# Patient Record
Sex: Female | Born: 1980 | Race: Black or African American | Hispanic: No | Marital: Single | Smoking: Current every day smoker
Health system: Southern US, Community
[De-identification: ages and names within clinical notes are randomized; demographics above are authoritative.]

## PROBLEM LIST (undated history)

## (undated) DIAGNOSIS — R011 Cardiac murmur, unspecified: Secondary | ICD-10-CM

## (undated) HISTORY — DX: Cardiac murmur, unspecified: R01.1

---

## 2001-02-13 ENCOUNTER — Emergency Department (HOSPITAL_COMMUNITY): Admission: EM | Admit: 2001-02-13 | Discharge: 2001-02-13 | Payer: Self-pay | Admitting: Emergency Medicine

## 2003-05-23 ENCOUNTER — Inpatient Hospital Stay (HOSPITAL_COMMUNITY): Admission: AD | Admit: 2003-05-23 | Discharge: 2003-05-23 | Payer: Self-pay | Admitting: Obstetrics and Gynecology

## 2005-01-07 ENCOUNTER — Inpatient Hospital Stay (HOSPITAL_COMMUNITY): Admission: AD | Admit: 2005-01-07 | Discharge: 2005-01-07 | Payer: Self-pay | Admitting: Obstetrics & Gynecology

## 2006-12-07 ENCOUNTER — Emergency Department (HOSPITAL_COMMUNITY): Admission: EM | Admit: 2006-12-07 | Discharge: 2006-12-07 | Payer: Self-pay | Admitting: Emergency Medicine

## 2008-05-05 IMAGING — CR DG CHEST 2V
2 series · 2 of 2 positions shown · non-contrast
Comparison: None

CLINICAL DATA: Syncope. Weakness.

CHEST - 2 VIEW

[w chest pa]
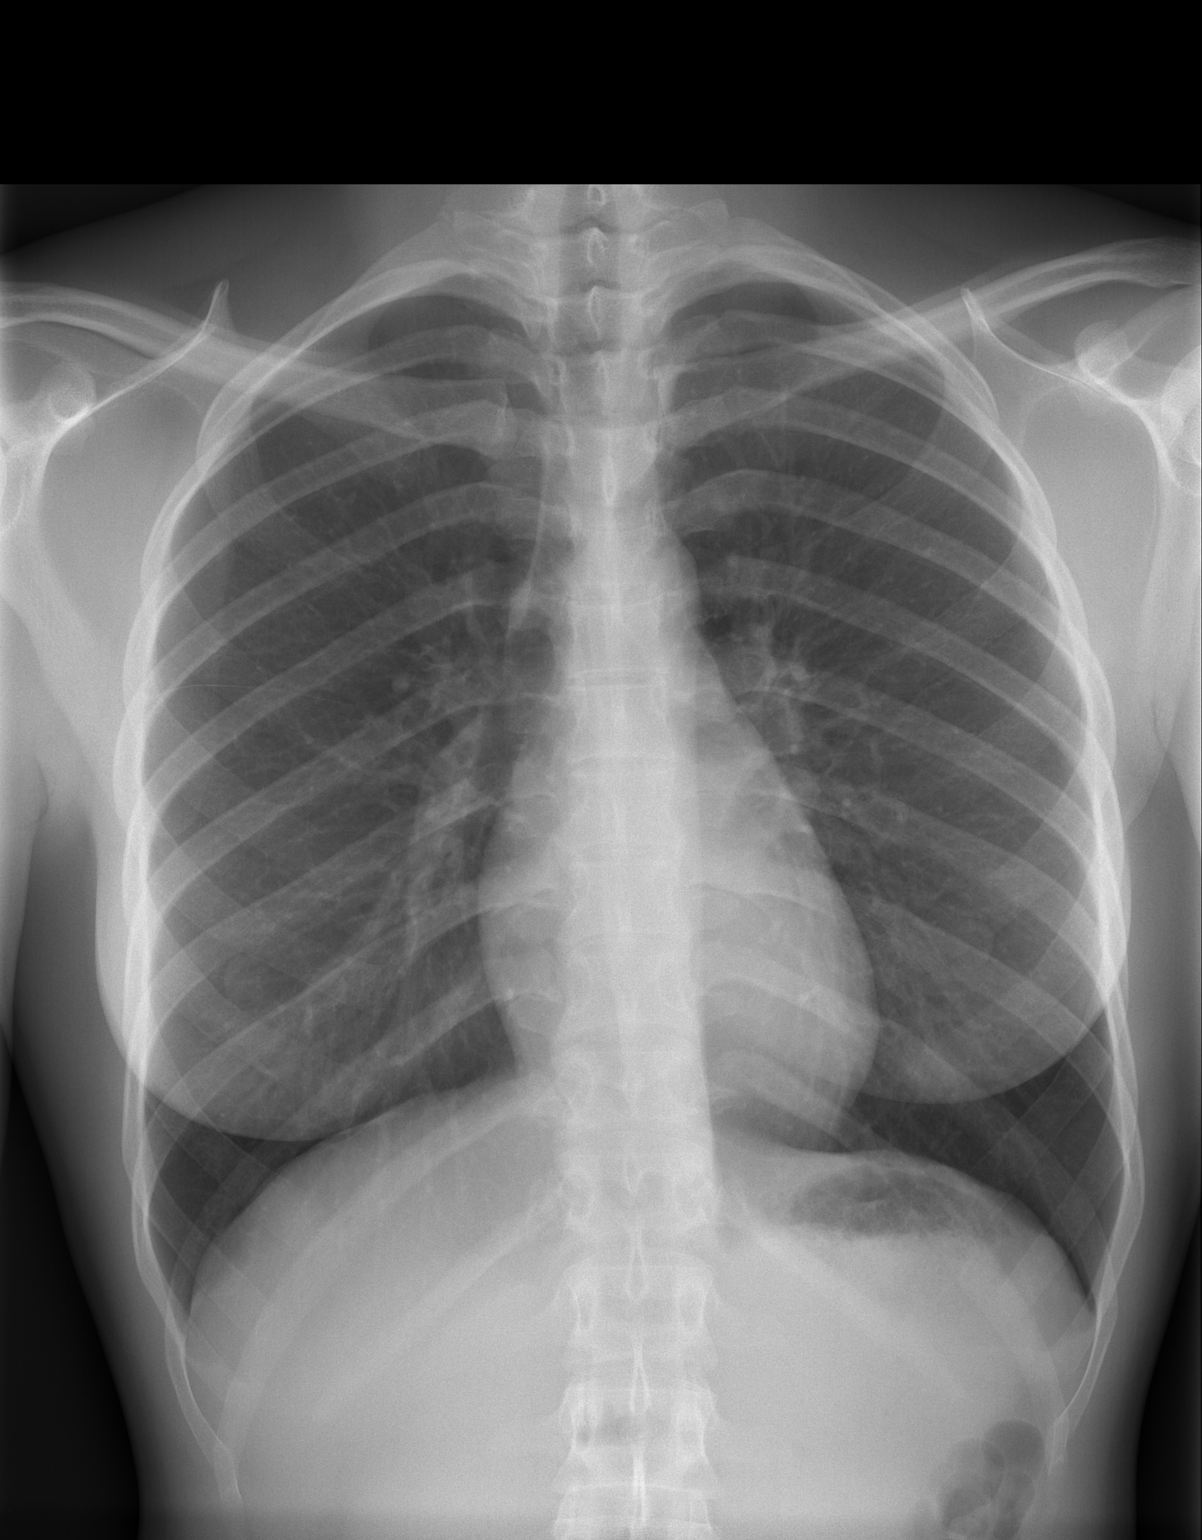

[w chest lat]
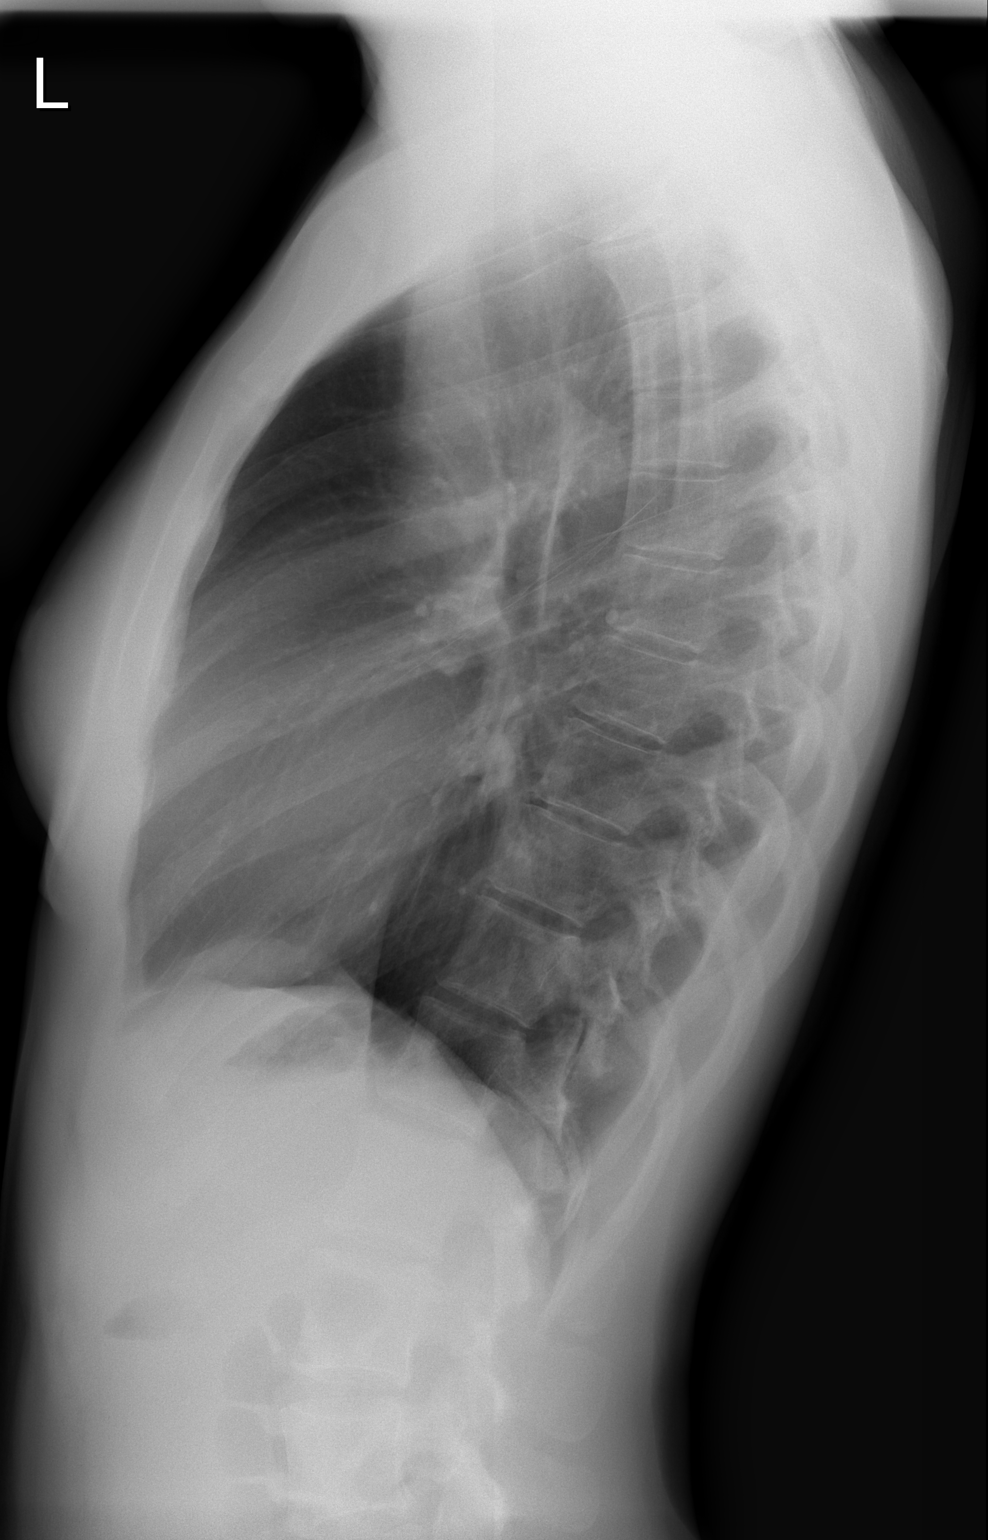

[2 of 2 positions shown; findings below may reference images not displayed]

FINDINGS: The lungs appear clear. The heart and mediastinum appear
unremarkable. No acute thoracic findings are detected.

IMPRESSION

Normal exam.

## 2017-10-14 ENCOUNTER — Encounter (HOSPITAL_COMMUNITY): Payer: Self-pay | Admitting: Emergency Medicine

## 2017-10-14 ENCOUNTER — Ambulatory Visit (HOSPITAL_COMMUNITY)
Admission: EM | Admit: 2017-10-14 | Discharge: 2017-10-14 | Disposition: A | Discharge status: Home / Self Care | Payer: Self-pay | Attending: Family Medicine | Admitting: Family Medicine

## 2017-10-14 DIAGNOSIS — K0889 Other specified disorders of teeth and supporting structures: Secondary | ICD-10-CM

## 2017-10-14 DIAGNOSIS — R220 Localized swelling, mass and lump, head: Secondary | ICD-10-CM

## 2017-10-14 MED ORDER — AMOXICILLIN-POT CLAVULANATE 875-125 MG PO TABS: 1.0000 | ORAL_TABLET | Freq: Two times a day (BID) | ORAL | 0 refills | Status: AC

## 2017-10-14 NOTE — Discharge Instructions (Signed)
These follow-up on Friday with demonstrated at have the tooth pulled, evaluate swelling.  Today we have given you an antibiotic. This should help with pain as any infection is cleared.   For pain please take 600mg -800mg  of Ibuprofen every 8 hours, take with 1000 mg of Tylenol Extra strength every 8 hours. These are safe to take together. Please take with food.   Please return if you start to experience significant swelling of your face, experiencing fever.

## 2017-10-14 NOTE — ED Provider Notes (Signed)
[facility]    CSN: [id number] Arrival date & time: 10/14/17  1308     History   Chief Complaint Chief Complaint  Patient presents with  . Dental Problem    HPI Megan Bean is a 37 y.o. female significant past medical history presenting today with dental pain. She states that she has pain and swelling to the her left lower jaw.  She has an appointment on Friday in 3 days to get her tooth pulled.  She states her pain is a 3 out of 10.  Mainly concerned about the swelling.  Her swelling began yesterday.  Denies fevers.  Denies difficulty swallowing.  HPI  History reviewed. No pertinent past medical history.  There are no active problems to display for this patient.   History reviewed. No pertinent surgical history.  OB History    No data available       Home Medications    Prior to Admission medications   Medication Sig Start Date End Date Taking? Authorizing Provider  valACYclovir (VALTREX) 500 MG tablet Take 500 mg by mouth 2 (two) times daily.   Yes [provider]  amoxicillin-clavulanate (AUGMENTIN) 875-125 MG tablet Take 1 tablet by mouth every 12 (twelve) hours for 5 days. 10/14/17 10/19/17  Tequilla Cousineau, Junius CreamerHallie C, PA-C    Family History No family history on file.  Social History Social History   Tobacco Use  . Smoking status: Not on file  Substance Use Topics  . Alcohol use: Not on file  . Drug use: Not on file     Allergies   Patient has no known allergies.   Review of Systems Review of Systems  Constitutional: Negative for fatigue and fever.  HENT: Positive for dental problem. Negative for trouble swallowing.   Respiratory: Negative for shortness of breath.   Cardiovascular: Negative for chest pain.  Gastrointestinal: Negative for nausea and vomiting.  Neurological: Positive for headaches. Negative for dizziness, weakness and light-headedness.     Physical Exam Triage Vital Signs ED Triage Vitals [10/14/17 1339]  Enc  Vitals Group     BP 130/81     Pulse Rate 86     Resp 16     Temp 98.8 F (37.1 C)     Temp Source Oral     SpO2 100 %     Weight 120 lb (54.4 kg)     Height 5\' 6"  (1.676 m)     Head Circumference      Peak Flow      Pain Score 3     Pain Loc      Pain Edu?      Excl. in GC?    No data found.  Updated Vital Signs BP 130/81   Pulse 86   Temp 98.8 F (37.1 C) (Oral)   Resp 16   Ht 5\' 6"  (1.676 m)   Wt 120 lb (54.4 kg)   LMP 10/11/2017   SpO2 100%   BMI 19.37 kg/m   Visual Acuity Right Eye Distance:   Left Eye Distance:   Bilateral Distance:    Right Eye Near:   Left Eye Near:    Bilateral Near:     Physical Exam  Constitutional: She is oriented to person, place, and time. She appears well-developed and well-nourished. No distress.  HENT:  Head: Normocephalic and atraumatic.  Diffusely poor dentition with multiple fractured teeth and decay.  Patient has obvious left-sided facial swelling to her left lower jaw.  Lower left  jaw gingiva and buccal mucosa without tenderness to palpation.  Eyes: Conjunctivae are normal.  Neck: Normal range of motion. Neck supple.  Cardiovascular: Normal rate.  Pulmonary/Chest: Effort normal. No respiratory distress.  Lymphadenopathy:    She has no cervical adenopathy.  Neurological: She is alert and oriented to person, place, and time.  Skin: Skin is warm and dry.  Psychiatric: She has a normal mood and affect.  Nursing note and vitals reviewed.    UC Treatments / Results  Labs (all labs ordered are listed, but only abnormal results are displayed) Labs Reviewed - No data to display  EKG  EKG Interpretation None       Radiology No results found.  Procedures Procedures (including critical care time)  Medications Ordered in UC Medications - No data to display   Initial Impression / Assessment and Plan / UC Course  I have reviewed the triage vital signs and the nursing notes.  Pertinent labs & imaging results  that were available during my care of the patient were reviewed by me and considered in my medical decision making (see chart for details).     Provided patient with Augmentin to take for 5 days twice daily.  Follow-up with dentistry on Friday for tooth removal and follow-up on swelling. Discussed strict return precautions. Patient verbalized understanding and is agreeable with plan.  Patient not in significant pain, recommended to continue Tylenol and ibuprofen.  She does not need narcotics at this time.   Final Clinical Impressions(s) / UC Diagnoses   Final diagnoses:  Left facial swelling  Pain, dental    ED Discharge Orders        Ordered    amoxicillin-clavulanate (AUGMENTIN) 875-125 MG tablet  Every 12 hours     10/14/17 1400       Controlled Substance Prescriptions [place] Controlled Substance Registry consulted? Not Applicable   Lew Dawes, [place] 10/14/17 1409

## 2017-10-14 NOTE — ED Triage Notes (Signed)
PT has pain and swelling to left lower jaw. PT has an appointment Friday to get tooth pulled

## 2018-01-29 ENCOUNTER — Ambulatory Visit (HOSPITAL_COMMUNITY)
Admission: EM | Admit: 2018-01-29 | Discharge: 2018-01-29 | Disposition: A | Discharge status: Home / Self Care | Payer: Self-pay | Attending: Family Medicine | Admitting: Family Medicine

## 2018-01-29 ENCOUNTER — Encounter (HOSPITAL_COMMUNITY): Payer: Self-pay | Admitting: Family Medicine

## 2018-01-29 DIAGNOSIS — K047 Periapical abscess without sinus: Secondary | ICD-10-CM

## 2018-01-29 MED ORDER — PENICILLIN V POTASSIUM 500 MG PO TABS: 500.0000 mg | ORAL_TABLET | Freq: Four times a day (QID) | ORAL | 0 refills | Status: AC

## 2018-01-29 NOTE — ED Provider Notes (Signed)
[facility]    CSN: [id number] Arrival date & time: 01/29/18  1114     History   Chief Complaint Chief Complaint  Patient presents with  . Abscess    HPI Megan Bean is a 37 y.o. female.  Patient is here with dental pain.  She has had this problem before.  In general her teeth are in carious conditions.  She is unable to afford going to the dentist.   HPI  History reviewed. No pertinent past medical history.  There are no active problems to display for this patient.   History reviewed. No pertinent surgical history.  OB History   None      Home Medications    Prior to Admission medications   Medication Sig Start Date End Date Taking? Authorizing Provider  valACYclovir (VALTREX) 500 MG tablet Take 500 mg by mouth 2 (two) times daily.    [provider]    Family History History reviewed. No pertinent family history.  Social History Social History   Tobacco Use  . Smoking status: Not on file  Substance Use Topics  . Alcohol use: Not on file  . Drug use: Not on file     Allergies   Patient has no known allergies.   Review of Systems Review of Systems  Constitutional: Negative for chills and fever.  HENT: Positive for dental problem. Negative for ear pain and sore throat.   Eyes: Negative for pain and visual disturbance.  Respiratory: Negative for cough and shortness of breath.   Cardiovascular: Negative for chest pain and palpitations.  Gastrointestinal: Negative for abdominal pain and vomiting.  Genitourinary: Negative for dysuria and hematuria.  Musculoskeletal: Negative for arthralgias and back pain.  Skin: Negative for color change and rash.  Neurological: Negative for seizures and syncope.  All other systems reviewed and are negative.    Physical Exam Triage Vital Signs ED Triage Vitals [01/29/18 1137]  Enc Vitals Group     BP 119/72     Pulse Rate 87     Resp 18     Temp 98.5 F (36.9 C)     Temp src    SpO2 100 %     Weight      Height      Head Circumference      Peak Flow      Pain Score 5     Pain Loc      Pain Edu?      Excl. in GC?    No data found.  Updated Vital Signs BP 119/72   Pulse 87   Temp 98.5 F (36.9 C)   Resp 18   SpO2 100%   Visual Acuity Right Eye Distance:   Left Eye Distance:   Bilateral Distance:    Right Eye Near:   Left Eye Near:    Bilateral Near:     Physical Exam  Constitutional: She appears well-developed and well-nourished.  HENT:  Mouth/Throat: Oropharynx is clear and moist.  Left second molar is tender to palpation with tongue blade there is swelling and inflammation at the base of the tooth.  She has obvious facial swelling as well secondary to infection     UC Treatments / Results  Labs (all labs ordered are listed, but only abnormal results are displayed) Labs Reviewed - No data to display  EKG None  Radiology No results found.  Procedures Procedures (including critical care time)  Medications Ordered in UC Medications - No data to  display  Initial Impression / Assessment and Plan / UC Course  I have reviewed the triage vital signs and the nursing notes.  Pertinent labs & imaging results that were available during my care of the patient were reviewed by me and considered in my medical decision making (see chart for details).     Dental abscess.  Will treat with penicillin 500 mg 4 times a day for 1 week.  Encourage follow-up with dentist if possible Final Clinical Impressions(s) / UC Diagnoses   Final diagnoses:  None   Discharge Instructions   None    ED Prescriptions    None     Controlled Substance Prescriptions [place] Controlled Substance Registry consulted? No   Frederica Kuster, MD 01/29/18 [phone]

## 2018-01-29 NOTE — ED Triage Notes (Signed)
Pt here for dental abscess to left lower mouth x a few days. Sig swelling.

## 2022-04-17 ENCOUNTER — Other Ambulatory Visit: Payer: Self-pay

## 2022-04-17 DIAGNOSIS — R1319 Other dysphagia: Secondary | ICD-10-CM | POA: Diagnosis not present

## 2022-04-17 DIAGNOSIS — R1312 Dysphagia, oropharyngeal phase: Secondary | ICD-10-CM | POA: Insufficient documentation

## 2022-04-17 DIAGNOSIS — R1310 Dysphagia, unspecified: Secondary | ICD-10-CM | POA: Diagnosis not present

## 2022-04-17 DIAGNOSIS — R0989 Other specified symptoms and signs involving the circulatory and respiratory systems: Secondary | ICD-10-CM | POA: Diagnosis not present

## 2022-04-17 DIAGNOSIS — J029 Acute pharyngitis, unspecified: Secondary | ICD-10-CM | POA: Diagnosis present

## 2022-04-18 ENCOUNTER — Emergency Department (HOSPITAL_COMMUNITY)
Admission: EM | Admit: 2022-04-18 | Discharge: 2022-04-18 | Disposition: A | Discharge status: Home / Self Care | Payer: Managed Care, Other (non HMO) | Attending: Emergency Medicine | Admitting: Emergency Medicine

## 2022-04-18 ENCOUNTER — Emergency Department (HOSPITAL_COMMUNITY): Payer: Managed Care, Other (non HMO)

## 2022-04-18 ENCOUNTER — Other Ambulatory Visit: Payer: Self-pay

## 2022-04-18 DIAGNOSIS — R1310 Dysphagia, unspecified: Secondary | ICD-10-CM

## 2022-04-18 DIAGNOSIS — R1312 Dysphagia, oropharyngeal phase: Secondary | ICD-10-CM

## 2022-04-18 DIAGNOSIS — R1319 Other dysphagia: Secondary | ICD-10-CM

## 2022-04-18 DIAGNOSIS — R0989 Other specified symptoms and signs involving the circulatory and respiratory systems: Secondary | ICD-10-CM

## 2022-04-18 MED ORDER — PANTOPRAZOLE SODIUM 40 MG PO TBEC: 40.0000 mg | DELAYED_RELEASE_TABLET | Freq: Two times a day (BID) | ORAL | 1 refills | Status: AC

## 2022-04-18 MED ORDER — PANTOPRAZOLE SODIUM 40 MG PO TBEC: 40.0000 mg | DELAYED_RELEASE_TABLET | Freq: Two times a day (BID) | ORAL | 1 refills | Status: DC

## 2022-04-18 NOTE — Discharge Instructions (Addendum)
Expect Dr. Orvan Falconer team to call you to set up an outpatient upper endoscopy.  Please take the medications that are prescribed until then.  For today, we recommend that you pursue only clear liquid diet.  If you are able to tolerate that, then starting tomorrow you can progress to soft diet for the next 3 days.  If you continue to do well with soft diet, then you can advance to normal diet, but try not to swallow large chunks of food.  Return to the emergency room if you start having choking with swallowing or you start drooling/regurgitating food.

## 2022-04-18 NOTE — ED Provider Notes (Signed)
[facility] EMERGENCY DEPARTMENT Provider Note   CSN: [id number] Arrival date & time: 04/17/22  2356     History  Chief Complaint  Patient presents with   Sore Throat    Megan Bean is a 41 y.o. female.  HPI     41 y/o pt comes in with cc of sore throat. Pt indicates that about an hour after her dinner, she started feeling like something is stuck in her throat. She had regurgitated food once when she woke up after her nap post dinner, but not since. After the regurgitation is when she started having foreign body sensation. No history of similar issues in the past. The meal was mostly soft food.  Home Medications Prior to Admission medications   Medication Sig Start Date End Date Taking? Authorizing Provider  pantoprazole (PROTONIX) 40 MG tablet Take 1 tablet (40 mg total) by mouth 2 (two) times daily before a meal. 04/18/22   Derwood Kaplan, MD  valACYclovir (VALTREX) 500 MG tablet Take 500 mg by mouth 2 (two) times daily.    [provider]      Allergies    Patient has no known allergies.    Review of Systems   Review of Systems  Physical Exam Updated Vital Signs BP 126/79 (BP Location: Right Arm)   Pulse 78   Temp 97.8 F (36.6 C) (Oral)   Resp 18   Ht 5\' 6"  (1.676 m)   Wt 54.4 kg   SpO2 100%   BMI 19.37 kg/m  Physical Exam Vitals and nursing note reviewed.  Constitutional:      Appearance: She is well-developed.  HENT:     Head: Atraumatic.     Mouth/Throat:     Mouth: Mucous membranes are moist.  Cardiovascular:     Rate and Rhythm: Normal rate.  Pulmonary:     Effort: Pulmonary effort is normal.  Musculoskeletal:     Cervical back: Normal range of motion and neck supple.  Skin:    General: Skin is warm and dry.  Neurological:     Mental Status: She is alert and oriented to person, place, and time.     ED Results / Procedures / Treatments   Labs (all labs ordered are listed, but only abnormal results are  displayed) Labs Reviewed - No data to display  EKG None  Radiology DG Neck Soft Tissue  Result Date: 04/18/2022 CLINICAL DATA:  Sensation of food stuck in throat EXAM: NECK SOFT TISSUES - 1+ VIEW COMPARISON:  None Available. FINDINGS: There is no evidence of retropharyngeal soft tissue swelling or epiglottic enlargement. The cervical airway is unremarkable and no radio-opaque foreign body identified. IMPRESSION: No radiopaque foreign body. Electronically Signed   By: 04/20/2022 M.D.   On: 04/18/2022 01:26    Procedures Procedures    Medications Ordered in ED Medications - No data to display  ED Course/ Medical Decision Making/ A&P                           Medical Decision Making Risk Prescription drug management.   This patient presents to the ED with chief complaint(s) of sore throat with no concerning past medical history.  The differential diagnosis includes globus pharyngis, food impaction, esophageal injury  No regurgitation, tolerating po well.  The initial plan is to discuss case with GI to ensure patient can get promp outpatient follow up. No emergent evaluation based on my assessment as  long as close outpatient f/u can be established    Independent visualization of imaging: - I independently visualized the following imaging with scope of interpretation limited to determining acute life threatening conditions related to emergency care: soft tissue neck xray, which revealed no evidence of foreign body.  Treatment and Reassessment: Given patient's symptoms - GI consulted. Dr. Daleen Bo team from GI saw the patient and have cleared patient for outpatient follow up and possible EGD.   Consultation: - Consulted or discussed management/test interpretation w/ external professional: GI consulted - they saw the patient and recommended outpatient workup.   Final Clinical Impression(s) / ED Diagnoses Final diagnoses:  Oropharyngeal dysphagia    Rx / DC Orders ED  Discharge Orders          Ordered    pantoprazole (PROTONIX) 40 MG tablet  2 times daily before meals,   Status:  Discontinued        04/18/22 1150    pantoprazole (PROTONIX) 40 MG tablet  2 times daily before meals        04/18/22 1158              Derwood Kaplan, MD 04/18/22 1258

## 2022-04-18 NOTE — ED Triage Notes (Signed)
Pt states she woke up tonight from her sleep feeling like something is stuck in her throat. Pt speaking in clear full sentences without resp distress in triage. Is able to keep down water.

## 2022-04-18 NOTE — Consult Note (Signed)
Consultation  Referring Provider:  ERMD  [facility] / Rhunette Croft Primary Care Physician:  Patient, No Pcp Per Primary Gastroenterologist:  none Gentry Fitz  Reason for Consultation:   Dysphagia  HPI: Megan Bean is a 41 y.o. female generally in good health with no known chronic medical problems who presented to the emergency room early this morning with complaints of dysphagia and a feeling as if something may be stuck in her throat. She says that occasionally over the past several months she has had a sensation when eating that foods wants to sit or get stuck briefly, she will stop eating and it will gradually traverse.  She has not had any episodes requiring regurgitation.  No complaints of chronic heartburn or indigestion. She says last night for dinner she ate boneless chicken breast, peas corn and mashed potatoes and did not have any difficulty with dysphagia or any sense that anything was stuck in her throat or esophagus. She went to bed then woke up about 10 PM because she had to go to the bathroom and when she got up had a tight feeling in her throat like something was stuck.  No associated nausea or vomiting.  Able to swallow her saliva.  Sensation was persisting so she came to the emergency room.  She says at this point she feels better does not have the tight sensation and does not sense that anything is stuck but feels sore in that area. She is able to swallow her saliva without any difficulty and has been given water by the emergency room has been able to swallow diet without any difficulty though senses the mild soreness.  She has swallowed a bottle of water.    No past medical history on file.  No past surgical history on file.  Prior to Admission medications   Medication Sig Start Date End Date Taking? Authorizing Provider  valACYclovir (VALTREX) 500 MG tablet Take 500 mg by mouth 2 (two) times daily.    [provider]    No current facility-administered  medications for this encounter.   Current Outpatient Medications  Medication Sig Dispense Refill   valACYclovir (VALTREX) 500 MG tablet Take 500 mg by mouth 2 (two) times daily.      Allergies as of 04/17/2022   (No Known Allergies)    No family history on file.  Social History   Socioeconomic History   Marital status: Single    Spouse name: Not on file   Number of children: Not on file   Years of education: Not on file   Highest education level: Not on file  Occupational History   Not on file  Tobacco Use   Smoking status: Not on file   Smokeless tobacco: Not on file  Substance and Sexual Activity   Alcohol use: Not on file   Drug use: Not on file   Sexual activity: Not on file  Other Topics Concern   Not on file  Social History Narrative   Not on file   Social Determinants of Health   Financial Resource Strain: Not on file  Food Insecurity: Not on file  Transportation Needs: Not on file  Physical Activity: Not on file  Stress: Not on file  Social Connections: Not on file  Intimate Partner Violence: Not on file    Review of Systems: Pertinent positive and negative review of systems were noted in the above HPI section.  All other review of systems was otherwise negative.   Physical Exam: Vital  signs in last 24 hours: Temp:  [98.2 F (36.8 C)-98.6 F (37 C)] 98.5 F (36.9 C) (08/17 0625) Pulse Rate:  [72-88] 74 (08/17 0625) Resp:  [16-18] 17 (08/17 0625) BP: (114-132)/(68-83) 118/72 (08/17 0625) SpO2:  [98 %-100 %] 100 % (08/17 0625) Weight:  [54.4 kg] 54.4 kg (08/17 0014)   General:   Alert,  Well-developed, well-nourished, AA female pleasant and cooperative in NAD- swallowing saliva and has had a whole bottle of water Head:  Normocephalic and atraumatic. Eyes:  Sclera clear, no icterus.   Conjunctiva pink. Ears:  Normal auditory acuity. Nose:  No deformity, discharge,  or lesions. Mouth:  No deformity or lesions.   Neck:  Supple; no masses or  thyromegaly., no edema, nontender Lungs:  Clear throughout to auscultation.   No wheezes, crackles, or rhonchi. Heart:  Regular rate and rhythm; no murmurs, clicks, rubs,  or gallops. Abdomen:  Soft,nontender, BS active,nonpalp mass or hsm.   Rectal:  not done  Msk:  Symmetrical without gross deformities. . Pulses:  Normal pulses noted. Extremities:  Without clubbing or edema. Neurologic:  Alert and  oriented x4;  grossly normal neurologically. Skin:  Intact without significant lesions or rashes.. Psych:  Alert and cooperative. Normal mood and affect.  Intake/Output from previous day: No intake/output data recorded. Intake/Output this shift: No intake/output data recorded.  Lab Results: No results for input(s): "WBC", "HGB", "HCT", "PLT" in the last 72 hours. BMET No results for input(s): "NA", "K", "CL", "CO2", "GLUCOSE", "BUN", "CREATININE", "CALCIUM" in the last 72 hours. LFT No results for input(s): "PROT", "ALBUMIN", "AST", "ALT", "ALKPHOS", "BILITOT", "BILIDIR", "IBILI" in the last 72 hours. PT/INR No results for input(s): "LABPROT", "INR" in the last 72 hours. Hepatitis Panel No results for input(s): "HEPBSAG", "HCVAB", "HEPAIGM", "HEPBIGM" in the last 72 hours.   IMPRESSION:  #[age] year old African-American female presenting with dysphagia/discomfort in throat with sensation of foreign body. No prior GI issues though she says over the past few months has had some occasional mild solid food dysphagia, no episodes requiring regurgitation.  No heartburn or indigestion.  Ate her dinner without any difficulty last night, then went to bed and woke up about 10 PM to go to the bathroom and felt a tight feeling in her lower throat disease( points to the supraclavicular notch)  Sensation persisted and she presented to the emergency room.  Her symptoms have improved, she is able to swallow her saliva and has been since onset without difficulty.  She has been able to drink a bottle of  water over the past hour doubt any difficulty. Primarily complaining of some mild soreness in that area now  I do not think she has a food impaction or foreign body fortunately-suspect related to episode of acid reflux/nocturnal  She does have a complaints of intermittent solid food dysphagia so will need further outpatient evaluation  PLAN: Okay for discharge home Asked her to start with clear liquids today and gradually work her way up to full liquids and if tolerating without any difficulty can resume regular food tomorrow  Please discharge on Protonix 40 mg p.o. every morning  We are arranging for outpatient EGD with Dr. Orvan Falconer, hopefully next week   Shana Younge PA-C 04/18/2022, 10:55 AM

## 2022-04-18 NOTE — ED Provider Triage Note (Signed)
Emergency Medicine Provider Triage Evaluation Note  Megan Bean , a 41 y.o. female  was evaluated in triage.  Pt complains of foreign body in the throat, states that after eating she went to bed, woke back up and felt as if there is some stuck in the right side of her throat, she states prior to going to bed she ate boneless chicken, rice, and corn, she states that when she woke up she felt as if she regurgitated and now feels as if there is something stuck, see still tolerating p.o., still tolerating oral secretions she has never had this happen to her in the past.  Review of Systems  Positive: Foreign body in the throat, throat pain Negative: Nausea, vomiting  Physical Exam  BP 132/83   Pulse 88   Temp 98.6 F (37 C) (Oral)   Resp 16   Ht 5\' 6"  (1.676 m)   Wt 54.4 kg   SpO2 98%   BMI 19.37 kg/m  Gen:   Awake, no distress   Resp:  Normal effort  MSK:   Moves extremities without difficulty  Other:    Medical Decision Making  Medically screening exam initiated at 1:06 AM.  Appropriate orders placed.  Megan Bean was informed that the remainder of the evaluation will be completed by another provider, this initial triage assessment does not replace that evaluation, and the importance of remaining in the ED until their evaluation is complete.  Imaging have been ordered will need further work-up.   Laural Benes, PA-C 04/18/22 [phone]

## 2022-04-24 ENCOUNTER — Ambulatory Visit: Payer: Managed Care, Other (non HMO) | Admitting: Gastroenterology

## 2022-04-24 ENCOUNTER — Encounter: Payer: Self-pay | Admitting: Gastroenterology

## 2022-04-24 ENCOUNTER — Telehealth: Payer: Self-pay

## 2022-04-24 VITALS — BP 125/82 | HR 71 | Temp 98.0°F | Resp 16 | Ht 66.0 in | Wt 120.0 lb

## 2022-04-24 DIAGNOSIS — R1310 Dysphagia, unspecified: Secondary | ICD-10-CM | POA: Diagnosis not present

## 2022-04-24 DIAGNOSIS — K3189 Other diseases of stomach and duodenum: Secondary | ICD-10-CM

## 2022-04-24 MED ORDER — SODIUM CHLORIDE 0.9 % IV SOLN: 500.0000 mL | Freq: Once | INTRAVENOUS | Status: DC

## 2022-04-24 NOTE — Progress Notes (Signed)
Indications for EGD: Solid food dysphagia and globus  Please see my 04/18/2022 consultation note for complete details.  There is been no change in history of physical exam.  The patient remains an appropriate candidate for monitored anesthesia care in the [facility].

## 2022-04-24 NOTE — Telephone Encounter (Signed)
-----   Message from Tressia Danas, MD sent at 04/24/2022  9:25 AM EDT ----- Please schedule office visit with me in 4-6 weeks. Thanks.  KLB

## 2022-04-24 NOTE — Telephone Encounter (Signed)
Patient is already scheduled for follow up with Amy, PA in 4 weeks on 05/21/22 at 11:30 am.

## 2022-04-24 NOTE — Patient Instructions (Addendum)
Smoking cacation provided   Await pathology results.   Continue current medications.   Continue present medications including pantoprazole 40 mg BID.   YOU HAD AN ENDOSCOPIC PROCEDURE TODAY AT THE [facility]:   Refer to the procedure report that was given to you for any specific questions about what was found during the examination.  If the procedure report does not answer your questions, please call your gastroenterologist to clarify.  If you requested that your care partner not be given the details of your procedure findings, then the procedure report has been included in a sealed envelope for you to review at your convenience later.  YOU SHOULD EXPECT: Some feelings of bloating in the abdomen. Passage of more gas than usual.  Walking can help get rid of the air that was put into your GI tract during the procedure and reduce the bloating. If you had a lower endoscopy (such as a colonoscopy or flexible sigmoidoscopy) you may notice spotting of blood in your stool or on the toilet paper. If you underwent a bowel prep for your procedure, you may not have a normal bowel movement for a few days.  Please Note:  You might notice some irritation and congestion in your nose or some drainage.  This is from the oxygen used during your procedure.  There is no need for concern and it should clear up in a day or so.  SYMPTOMS TO REPORT IMMEDIATELY:  Following upper endoscopy (EGD)  Vomiting of blood or coffee ground material  New chest pain or pain under the shoulder blades  Painful or persistently difficult swallowing  New shortness of breath  Fever of 100F or higher  Black, tarry-looking stools  For urgent or emergent issues, a gastroenterologist can be reached at any hour by calling [phone]. Do not use MyChart messaging for urgent concerns.    DIET: We do recommend a small meal at first, but then you may proceed to your regular diet.  Drink plenty of fluids but you should  avoid alcoholic beverages for 24 hours.  ACTIVITY:  You should plan to take it easy for the rest of today and you should NOT DRIVE or use heavy machinery until tomorrow (because of the sedation medicines used during the test).    FOLLOW UP: Our staff will call the number listed on your records the next business day following your procedure.  We will call around 7:15- 8:00 am to check on you and address any questions or concerns that you may have regarding the information given to you following your procedure. If we do not reach you, we will leave a message.  If you develop any symptoms (ie: fever, flu-like symptoms, shortness of breath, cough etc.) before then, please call [phone].  If you test positive for Covid 19 in the 2 weeks post procedure, please call and report this information to [place].    If any biopsies were taken you will be contacted by phone or by letter within the next 1-3 weeks.  Please call us at [phone] if you have not heard about the biopsies in 3 weeks.    SIGNATURES/CONFIDENTIALITY: You and/or your care partner have signed paperwork which will be entered into your electronic medical record.  These signatures attest to the fact that that the information above on your After Visit Summary has been reviewed and is understood.  Full responsibility of the confidentiality of this discharge information lies with you and/or your care-partner.  Smoking Tobacco Information, Adult  Smoking tobacco can be harmful to your health. Tobacco contains a toxic colorless chemical called nicotine. Nicotine causes changes in your brain that make you want more and more. This is called addiction. This can make it hard to stop smoking once you start. Tobacco also has other toxic chemicals that can hurt your body and raise your risk of many cancers. Menthol or "lite" tobacco or cigarette brands are not safer than regular brands. How can smoking tobacco affect me? Smoking tobacco puts you at risk  for: Cancer. Smoking is most commonly associated with lung cancer, but can also lead to cancer in other parts of the body. Chronic obstructive pulmonary disease (COPD). This is a long-term lung condition that makes it hard to breathe. It also gets worse over time. High blood pressure (hypertension), heart disease, stroke, heart attack, and lung infections, such as pneumonia. Cataracts. This is when the lenses in the eyes become clouded. Digestive problems. This may include peptic ulcers, heartburn, and gastroesophageal reflux disease (GERD). Oral health problems, such as gum disease, mouth sores, and tooth loss. Loss of taste and smell. Smoking also affects how you look and smell. Smoking may cause: Wrinkles. Yellow or stained teeth, fingers, and fingernails. Bad breath. Bad-smelling clothes and hair. Smoking tobacco can also affect your social life, because: It may be challenging to find places to smoke when away from home. Many workplaces, [employer], hotels, and public places are tobacco-free. Smoking is expensive. This is due to the cost of tobacco and the long-term costs of treating health problems from smoking. Secondhand smoke may affect those around you. Secondhand smoke can cause lung cancer, breathing problems, and heart disease. Children of smokers have a higher risk for: Sudden infant death syndrome (SIDS). Ear infections. Lung infections. What actions can I take to prevent health problems? Quit smoking  Do not start smoking. Quit if you already smoke. Do not replace cigarette smoking with vaping devices, such as e-cigarettes. Make a plan to quit smoking and commit to it. Look for programs to help you, and ask your health care provider for recommendations and ideas. Set a date and write down all the reasons you want to quit. Let your friends and family know you are quitting so they can help and support you. Consider finding friends who also want to quit. It can be easier to  quit with someone else, so that you can support each other. Talk with your health care provider about using nicotine replacement medicines to help you quit. These include gum, lozenges, patches, sprays, or pills. If you try to quit but return to smoking, stay positive. It is common to slip up when you first quit, so take it one day at a time. Be prepared for cravings. When you feel the urge to smoke, chew gum or suck on hard candy. Lifestyle Stay busy. Take care of your body. Get plenty of exercise, eat a healthy diet, and drink plenty of water. Find ways to manage your stress, such as meditation, yoga, exercise, or time spent with friends and family. Ask your health care provider about having regular tests (screenings) to check for cancer. This may include blood tests, imaging tests, and other tests. Where to find support To get support to quit smoking, consider: Asking your health care provider for more information and resources. Joining a support group for people who want to quit smoking in your local community. There are many effective programs that may help you to quit. Calling the smokefree.gov counselor helpline at [employer] (  619-862-3356). Where to find more information You may find more information about quitting smoking from: Centers for Disease Control and Prevention: http://www.osborne.com/ [employer]: smokefree.gov American Lung Association: freedomfromsmoking.org Contact a health care provider if: You have problems breathing. Your lips, nose, or fingers turn blue. You have chest pain. You are coughing up blood. You feel like you will faint. You have other health changes that cause you to worry. Summary Smoking tobacco can negatively affect your health, the health of those around you, your finances, and your social life. Do not start smoking. Quit if you already smoke. If you need help quitting, ask your health care provider. Consider joining a support group for people in  your local community who want to quit smoking. There are many effective programs that may help you to quit. This information is not intended to replace advice given to you by your health care provider. Make sure you discuss any questions you have with your health care provider. Document Revised: 08/14/2021 Document Reviewed: 08/14/2021 Elsevier Patient Education  2023 [employer].

## 2022-04-24 NOTE — Progress Notes (Signed)
To pacu, VSS. Report to [email or web address] 

## 2022-04-24 NOTE — Progress Notes (Signed)
Called to room to assist during endoscopic procedure.  Patient ID and intended procedure confirmed with present staff. Received instructions for my participation in the procedure from the performing physician.  

## 2022-04-24 NOTE — Op Note (Addendum)
[place] Patient Name: Megan Bean Procedure Date: 04/24/2022 7:48 AM MRN: [id number] Endoscopist: Thornton Park MD, MD Age: 41 Referring MD:  Date of Birth: 1981-01-18 Gender: Female Account #: [id number] Procedure:                Upper GI endoscopy Indications:              Dysphagia, Globus sensation Medicines:                Monitored Anesthesia Care Procedure:                Pre-Anesthesia Assessment:                           - Prior to the procedure, a History and Physical                            was performed, and patient medications and                            allergies were reviewed. The patient's tolerance of                            previous anesthesia was also reviewed. The risks                            and benefits of the procedure and the sedation                            options and risks were discussed with the patient.                            All questions were answered, and informed consent                            was obtained. Prior Anticoagulants: The patient has                            taken no previous anticoagulant or antiplatelet                            agents. ASA Grade Assessment: II - A patient with                            mild systemic disease. After reviewing the risks                            and benefits, the patient was deemed in                            satisfactory condition to undergo the procedure.                           After obtaining informed consent, the endoscope was  passed under direct vision. Throughout the                            procedure, the patient's blood pressure, pulse, and                            oxygen saturations were monitored continuously. The                            GIF [id number] was introduced through the                            mouth, and advanced to the third part of duodenum.                            The upper GI endoscopy was  accomplished without                            difficulty. The patient tolerated the procedure                            well. Scope In: Scope Out: Findings:                 No endoscopic abnormality was evident in the                            esophagus to explain the patient's complaint of                            dysphagia. It was decided, however, to proceed with                            dilation in the distal esophagus. A TTS dilator was                            passed through the scope. Dilation with a 16-17-18                            mm balloon dilator was performed to 18 mm. A fully                            inflated balloon was easily advanced through the                            entire esophagus with no resistance. The dilation                            site was examined and showed no change. Estimated                            blood loss: none.  A single 2-3 mm benign-appearing lesion with no                            bleeding was found in the upper esophagus. Biopsies                            were taken with a cold forceps for histology after                            dilation was performed. Estimated blood loss was                            minimal.                           Localized mild mucosal changes characterized by                            Inlet patch were found in the upper to middle third                            of the esophagus. Biopsies were taken with a cold                            forceps for histology. Estimated blood loss was                            minimal.                           The Z-line was regular and was found 36 cm from the                            incisors. Biopsies were taken with a cold forceps                            for histology. Estimated blood loss was minimal.                           Localized mildly erythematous mucosa without                            bleeding was found in  the gastric body. Biopsies                            were taken from the antrum, body, and fundus with a                            cold forceps for histology. Estimated blood loss                            was minimal.  The cardia and gastric fundus were normal on                            retroflexion.                           The exam was otherwise without abnormality. Complications:            No immediate complications. Estimated Blood Loss:     Estimated blood loss was minimal. Impression:               - Benign esophageal lesion. Biopsied.                           - Inlet patch mucosa in the upper to mid esophagus.                            Biopsied.                           - Z-line regular, 36 cm from the incisors. Biopsied.                           - No endoscopic esophageal abnormality to explain                            patient's dysphagia. Esophagus dilated. Dilated.                           - Erythematous mucosa in the gastric body. Biopsied.                           - The examination was otherwise normal. Recommendation:           - Patient has a contact number available for                            emergencies. The signs and symptoms of potential                            delayed complications were discussed with the                            patient. Return to normal activities tomorrow.                            Written discharge instructions were provided to the                            patient.                           - Resume previous diet.                           - Continue present medications including  pantoprazole 40 mg BID.                           - Await pathology results.                           - Consider ENT evaluation if symptoms do not                            improve with 4-6 weeks of pantoprazole.                           - Quit smoking. Thornton Park MD, MD 04/24/2022  8:44:32 AM This report has been signed electronically.

## 2022-04-25 ENCOUNTER — Telehealth: Payer: Self-pay

## 2022-04-25 NOTE — Telephone Encounter (Signed)
  Follow up Call-     04/24/2022    7:57 AM  Call back number  Post procedure Call Back phone  # [phone]  Permission to leave phone message Yes     Patient questions:  Do you have a fever, pain , or abdominal swelling? No. Pain Score  0 *  Have you tolerated food without any problems? Yes.    Have you been able to return to your normal activities? Yes.    Do you have any questions about your discharge instructions: Diet   No. Medications  No. Follow up visit  No.  Do you have questions or concerns about your Care? No.  Actions: * If pain score is 4 or above: No action needed, pain <4.

## 2022-04-30 ENCOUNTER — Encounter: Payer: Self-pay | Admitting: Gastroenterology

## 2022-05-21 ENCOUNTER — Ambulatory Visit: Payer: Managed Care, Other (non HMO) | Admitting: Physician Assistant

## 2022-06-10 ENCOUNTER — Ambulatory Visit: Payer: Managed Care, Other (non HMO) | Admitting: Gastroenterology
